# Patient Record
Sex: Female | Born: 1975 | Race: White | Hispanic: No | Marital: Married
Health system: Southern US, Community
[De-identification: ages and names within clinical notes are randomized; demographics above are authoritative.]

---

## 2003-07-25 ENCOUNTER — Other Ambulatory Visit: Admission: RE | Admit: 2003-07-25 | Discharge: 2003-07-25 | Payer: Self-pay | Admitting: Gynecology

## 2006-01-04 ENCOUNTER — Emergency Department: Payer: Self-pay | Admitting: General Practice

## 2006-05-18 ENCOUNTER — Observation Stay: Payer: Self-pay | Admitting: Unknown Physician Specialty

## 2006-05-19 ENCOUNTER — Ambulatory Visit: Payer: Self-pay | Admitting: Unknown Physician Specialty

## 2006-06-20 ENCOUNTER — Observation Stay: Payer: Self-pay

## 2006-07-03 ENCOUNTER — Inpatient Hospital Stay: Payer: Self-pay

## 2006-07-20 ENCOUNTER — Observation Stay: Payer: Self-pay

## 2006-08-01 ENCOUNTER — Observation Stay: Payer: Self-pay

## 2006-08-08 ENCOUNTER — Inpatient Hospital Stay: Payer: Self-pay

## 2006-08-14 ENCOUNTER — Other Ambulatory Visit: Payer: Self-pay

## 2006-08-14 ENCOUNTER — Inpatient Hospital Stay: Payer: Self-pay | Admitting: Internal Medicine

## 2007-10-07 IMAGING — CR DG CHEST 2V
1 series · 2 of 2 positions shown · non-contrast
Comparison: none

REASON FOR EXAM: pulmonary edema
COMMENTS:

[Series 1: view not recorded · 0.17mm/px · 2 of 2 slices shown]
[im 1/2]
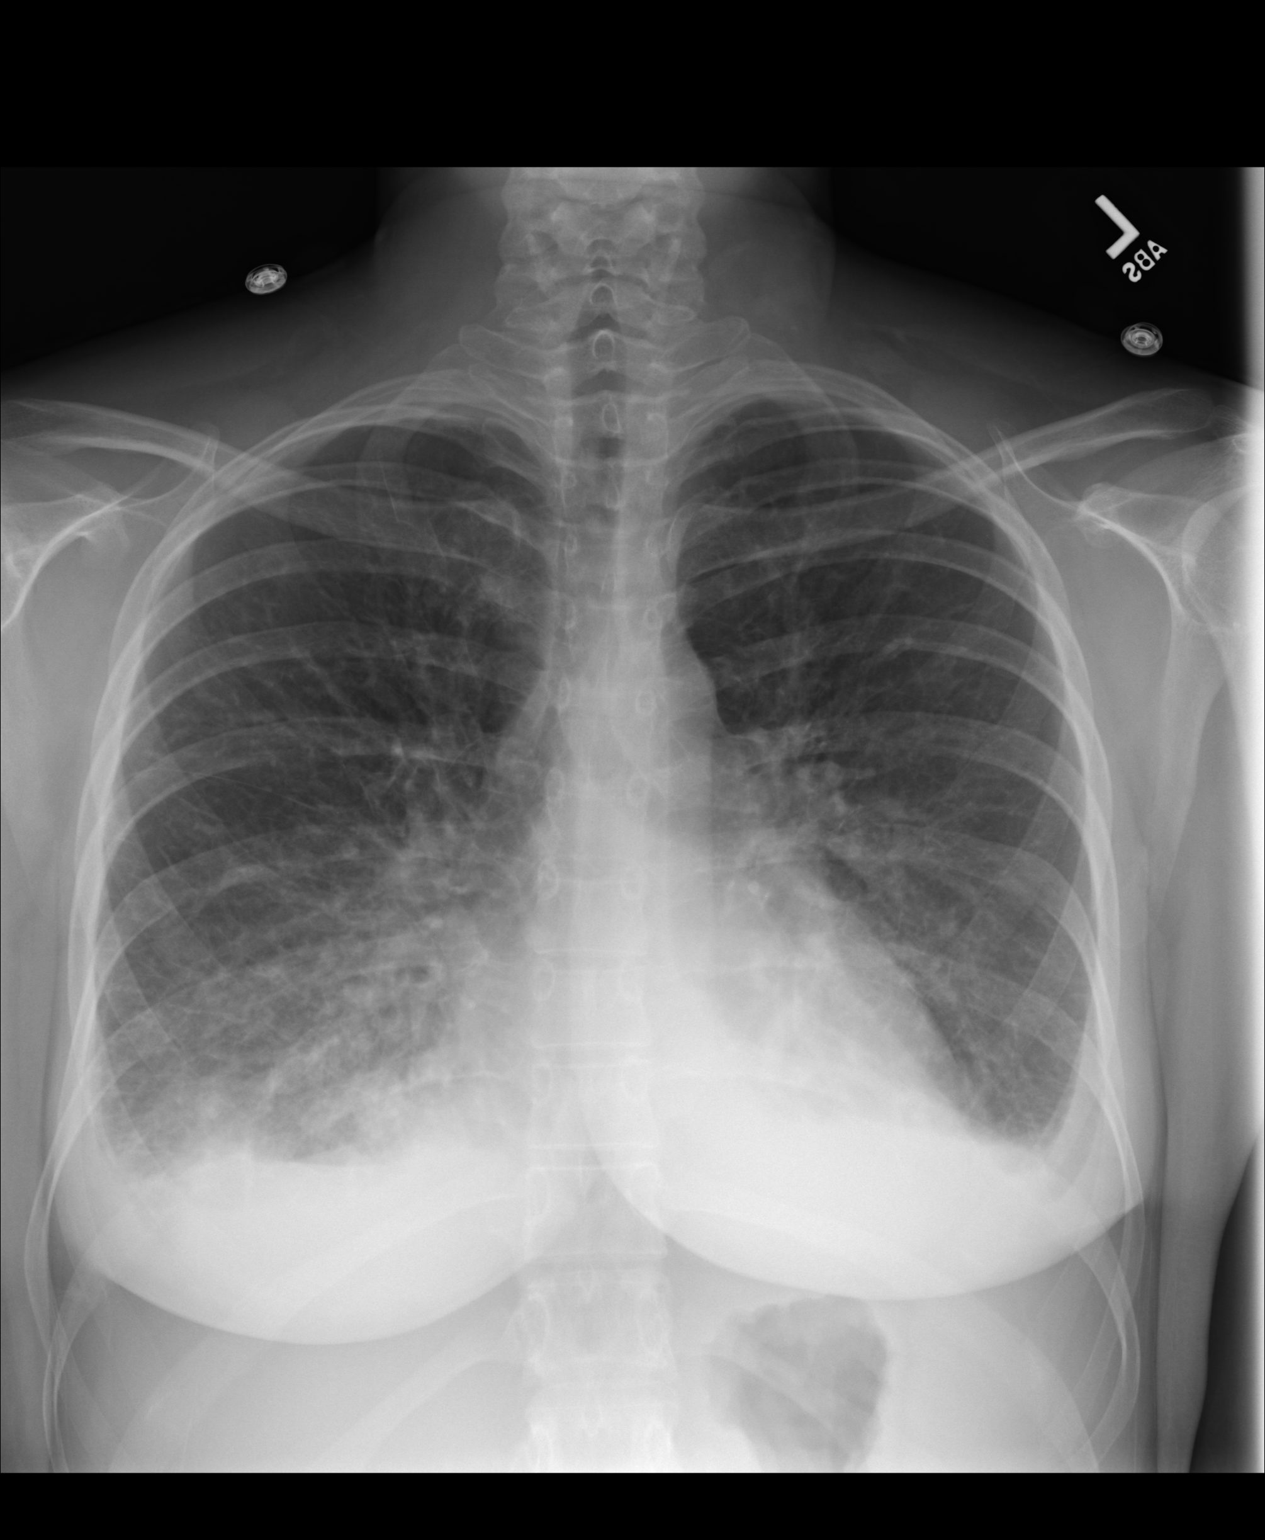
[im 2/2]
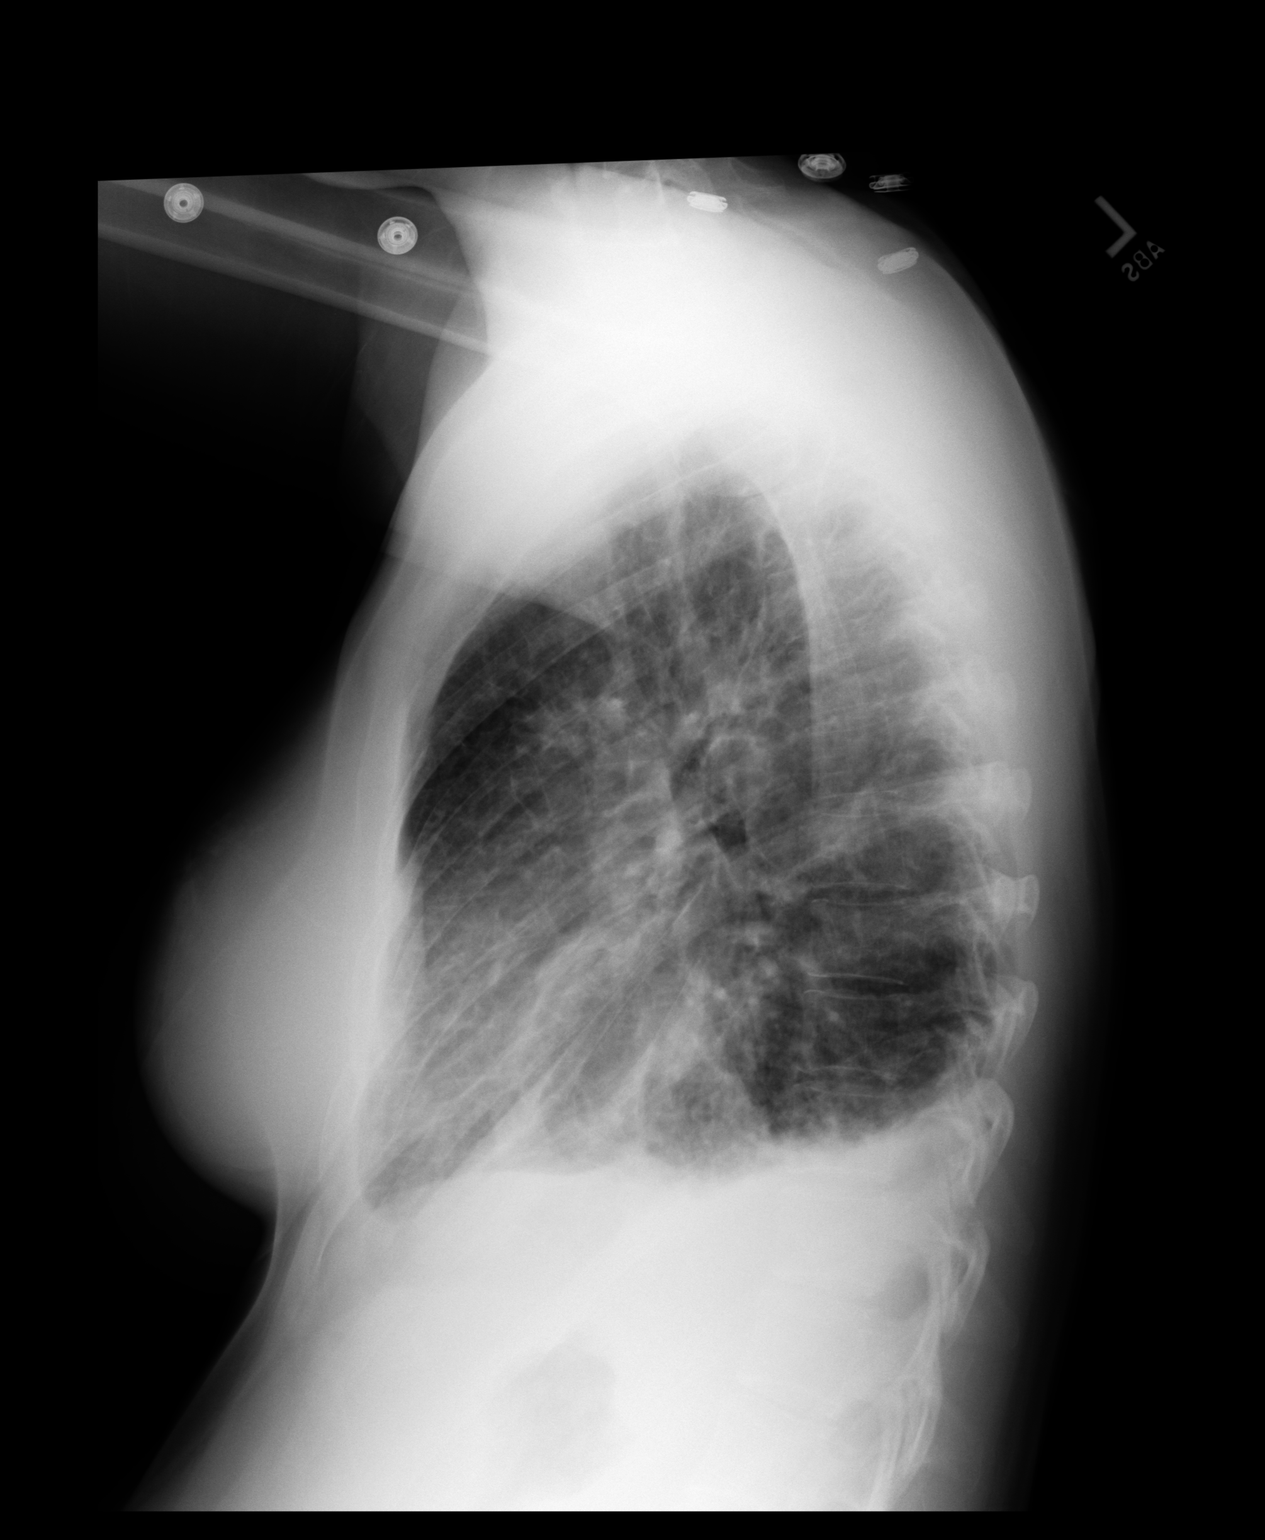

[2 of 2 positions shown; findings below may reference images not displayed]

PROCEDURE:     DXR - DXR CHEST PA (OR AP) AND LATERAL  - August 12, 2006 [DATE]

RESULT:          Comparison made to a study 11 August, 2006.

The interstitial and alveolar infiltrates remain present bilaterally, but
there has been some improvement since yesterday's study. There is a small
amount of pleural fluid layering posteriorly in the costophrenic gutters.
The cardiopericardial silhouette is mildly enlarged but stable.
IMPRESSION: There has been slight interval improvement in the
appearance of the pulmonary interstitial and alveolar infiltrates. Small
bilateral pleural effusions are present and are more conspicuous today.
Continued followup films are recommended. Chest CT scanning is available
upon request.

## 2007-10-09 IMAGING — CR DG CHEST 1V PORT
1 series · 1 of 1 positions shown · non-contrast
Comparison: none

REASON FOR EXAM: Shortness of breath, [HOSPITAL]
COMMENTS:

[view not recorded]
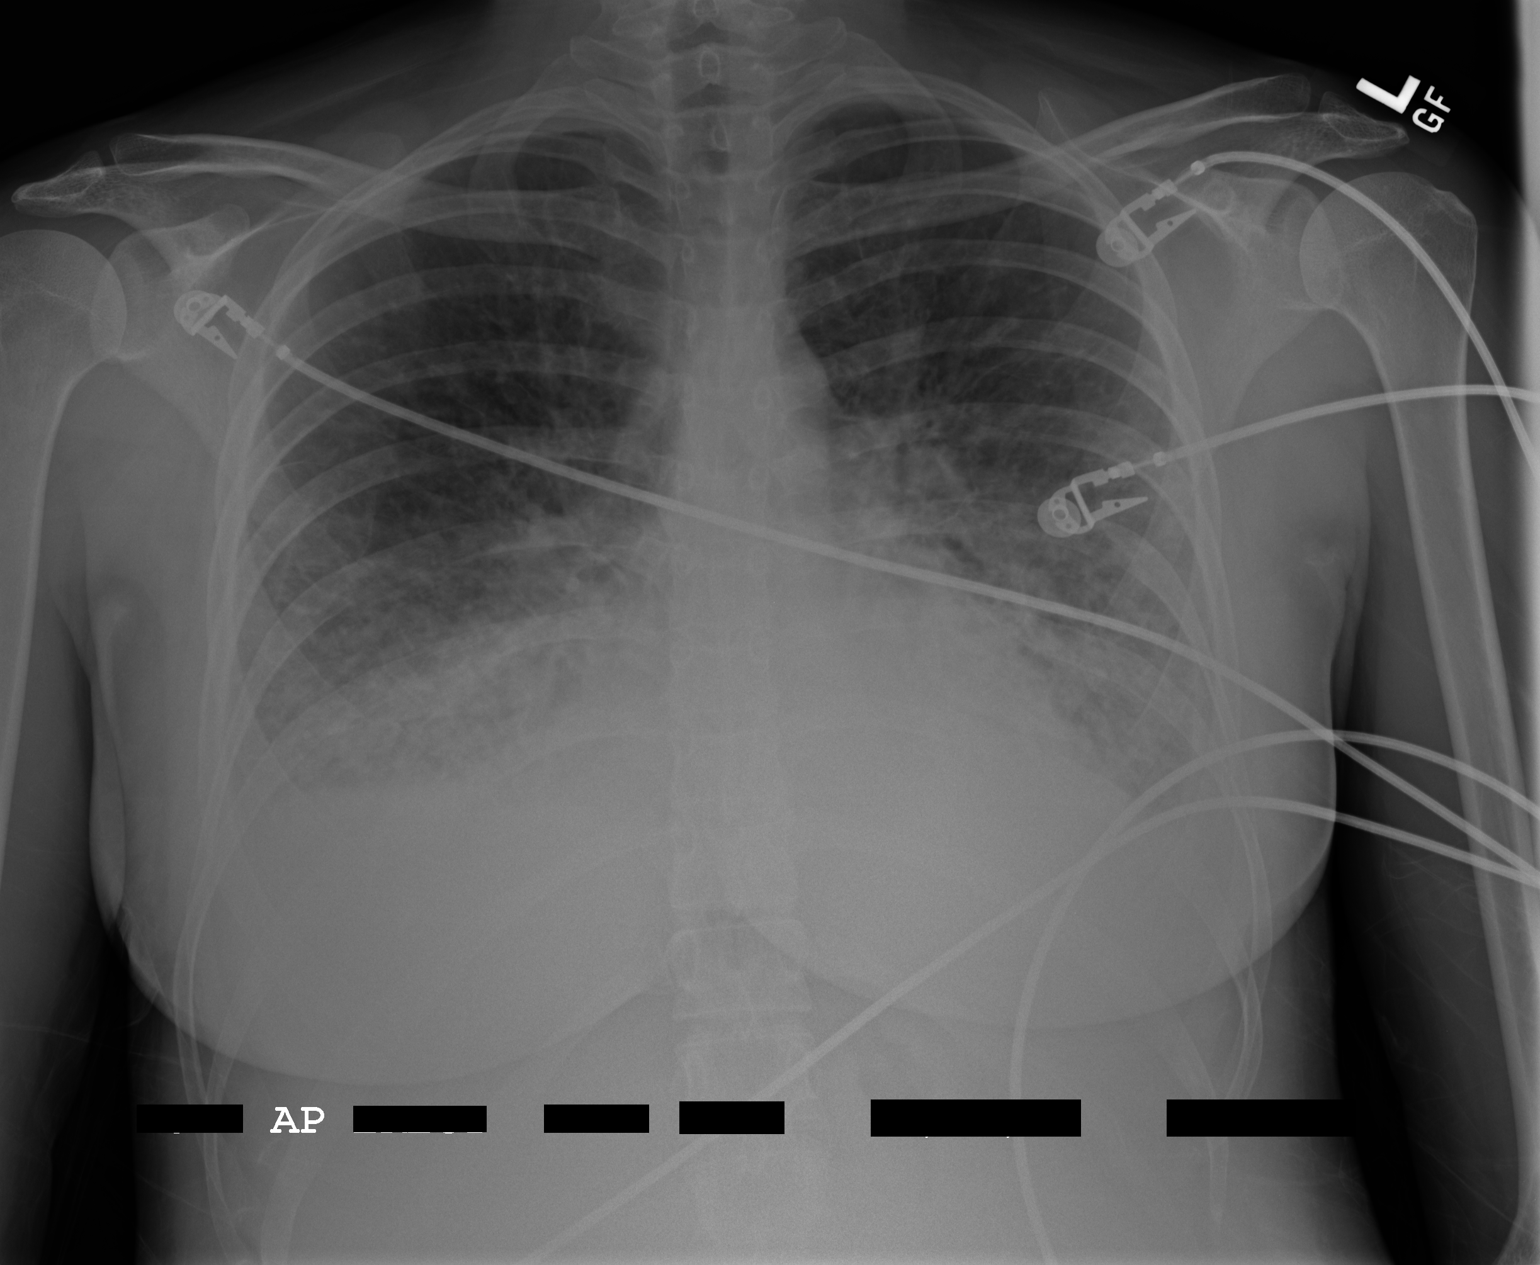

[1 of 1 positions shown; findings below may reference images not displayed]

PROCEDURE:     DXR - DXR PORTABLE CHEST SINGLE VIEW  - August 14, 2006  [DATE]

RESULT:       The patient has taken a shallow inspiration.  There is
thickening of the interstitial markings and peribronchial cuffing.
Bibasilar pulmonary opacities are demonstrated.  There is blunting of the
costophrenic angles.  The cardiac silhouette is difficult to evaluate
because of being obscured by the pulmonary opacities.  The visualized bony
skeleton is unremarkable.
IMPRESSION: Findings consistent with a persistent bibasilar infiltrate, possibly an
element of edema.  The patient has taken a shallow inspiration which may
accentuate the findings.  Repeat evaluation with deeper inspiration is
recommended if and as clinically warranted or surveillance evaluation.

## 2019-09-06 ENCOUNTER — Other Ambulatory Visit: Payer: Self-pay

## 2019-09-06 DIAGNOSIS — Z20822 Contact with and (suspected) exposure to covid-19: Secondary | ICD-10-CM

## 2019-09-08 ENCOUNTER — Telehealth: Payer: Self-pay

## 2019-09-08 LAB — NOVEL CORONAVIRUS, NAA: SARS-CoV-2, NAA: DETECTED — AB

## 2019-09-08 NOTE — Telephone Encounter (Signed)
Pt called for covid results-advised pt to keep checking MyChart for results.

## 2023-01-03 DIAGNOSIS — N3001 Acute cystitis with hematuria: Secondary | ICD-10-CM | POA: Diagnosis not present

## 2023-01-05 DIAGNOSIS — N921 Excessive and frequent menstruation with irregular cycle: Secondary | ICD-10-CM | POA: Diagnosis not present

## 2023-01-05 DIAGNOSIS — N390 Urinary tract infection, site not specified: Secondary | ICD-10-CM | POA: Diagnosis not present

## 2023-01-05 DIAGNOSIS — R350 Frequency of micturition: Secondary | ICD-10-CM | POA: Diagnosis not present

## 2023-09-04 DIAGNOSIS — N852 Hypertrophy of uterus: Secondary | ICD-10-CM | POA: Diagnosis not present

## 2023-09-04 DIAGNOSIS — R350 Frequency of micturition: Secondary | ICD-10-CM | POA: Diagnosis not present

## 2023-09-04 DIAGNOSIS — R1909 Other intra-abdominal and pelvic swelling, mass and lump: Secondary | ICD-10-CM | POA: Diagnosis not present

## 2023-09-06 ENCOUNTER — Telehealth: Payer: Self-pay | Admitting: *Deleted

## 2023-09-06 NOTE — Telephone Encounter (Signed)
Spoke with the patient regarding the referral to GYN oncology. Patient scheduled as new patient with Dr Alvester Morin on 12/9 at 9 am. Patient given an arrival time of *8:30 am.  Explained to the patient the the doctor will perform a pelvic exam at this visit. Patient given the policy that only one visitor allowed and that visitor must be over 16 yrs are allowed in the Cancer Center. Patient given the address/phone number for the clinic and that the center offers free valet service. Patient aware that masks are option.

## 2023-09-12 ENCOUNTER — Telehealth: Payer: Self-pay

## 2023-09-12 NOTE — Telephone Encounter (Signed)
Angel Oconnell called office stating she would like to cancel her new patient appointment for Monday December 9th with Dr. Alvester Morin. She states she got in at [facility] because it is closer to her home.

## 2023-09-22 DIAGNOSIS — N9489 Other specified conditions associated with female genital organs and menstrual cycle: Secondary | ICD-10-CM | POA: Diagnosis not present

## 2023-09-25 ENCOUNTER — Ambulatory Visit: Payer: Self-pay | Admitting: Psychiatry

## 2023-10-06 DIAGNOSIS — D251 Intramural leiomyoma of uterus: Secondary | ICD-10-CM | POA: Diagnosis not present

## 2023-10-06 DIAGNOSIS — R188 Other ascites: Secondary | ICD-10-CM | POA: Diagnosis not present

## 2023-10-06 DIAGNOSIS — D252 Subserosal leiomyoma of uterus: Secondary | ICD-10-CM | POA: Diagnosis not present

## 2023-10-06 DIAGNOSIS — J90 Pleural effusion, not elsewhere classified: Secondary | ICD-10-CM | POA: Diagnosis not present

## 2023-10-06 DIAGNOSIS — R109 Unspecified abdominal pain: Secondary | ICD-10-CM | POA: Diagnosis not present

## 2023-10-06 DIAGNOSIS — J9811 Atelectasis: Secondary | ICD-10-CM | POA: Diagnosis not present

## 2025-03-24 ENCOUNTER — Ambulatory Visit: Payer: Self-pay | Admitting: Family Medicine
